# Patient Record
Sex: Female | Born: 1945 | Race: White | Hispanic: No | Marital: Married
Health system: Southern US, Community
[De-identification: ages and names within clinical notes are randomized; demographics above are authoritative.]

## PROBLEM LIST (undated history)

## (undated) HISTORY — PX: BREAST EXCISIONAL BIOPSY: SUR124

---

## 2002-04-23 ENCOUNTER — Encounter: Admission: RE | Admit: 2002-04-23 | Discharge: 2002-04-23 | Payer: Self-pay | Admitting: *Deleted

## 2002-06-05 ENCOUNTER — Encounter: Admission: RE | Admit: 2002-06-05 | Discharge: 2002-06-05 | Payer: Self-pay | Admitting: Internal Medicine

## 2002-06-05 ENCOUNTER — Encounter: Payer: Self-pay | Admitting: Internal Medicine

## 2003-05-01 ENCOUNTER — Encounter: Admission: RE | Admit: 2003-05-01 | Discharge: 2003-05-01 | Payer: Self-pay | Admitting: Internal Medicine

## 2004-06-11 ENCOUNTER — Encounter: Admission: RE | Admit: 2004-06-11 | Discharge: 2004-06-11 | Payer: Self-pay | Admitting: Internal Medicine

## 2005-07-20 ENCOUNTER — Encounter: Admission: RE | Admit: 2005-07-20 | Discharge: 2005-07-20 | Payer: Self-pay | Admitting: Internal Medicine

## 2005-08-03 ENCOUNTER — Encounter: Admission: RE | Admit: 2005-08-03 | Discharge: 2005-08-03 | Payer: Self-pay | Admitting: *Deleted

## 2006-10-31 ENCOUNTER — Encounter: Admission: RE | Admit: 2006-10-31 | Discharge: 2006-10-31 | Payer: Self-pay

## 2007-11-15 ENCOUNTER — Encounter: Admission: RE | Admit: 2007-11-15 | Discharge: 2007-11-15 | Payer: Self-pay | Admitting: Internal Medicine

## 2007-11-22 ENCOUNTER — Encounter: Admission: RE | Admit: 2007-11-22 | Discharge: 2007-11-22 | Payer: Self-pay | Admitting: Internal Medicine

## 2010-05-17 ENCOUNTER — Encounter: Payer: Self-pay | Admitting: Internal Medicine

## 2010-10-20 ENCOUNTER — Other Ambulatory Visit: Payer: Self-pay | Admitting: *Deleted

## 2010-10-20 DIAGNOSIS — Z1231 Encounter for screening mammogram for malignant neoplasm of breast: Secondary | ICD-10-CM

## 2010-11-29 ENCOUNTER — Ambulatory Visit
Admission: RE | Admit: 2010-11-29 | Discharge: 2010-11-29 | Disposition: A | Discharge status: Home / Self Care | Payer: Medicare Other | Source: Ambulatory Visit | Attending: *Deleted | Admitting: *Deleted

## 2010-11-29 DIAGNOSIS — Z1231 Encounter for screening mammogram for malignant neoplasm of breast: Secondary | ICD-10-CM

## 2010-12-02 ENCOUNTER — Other Ambulatory Visit: Payer: Self-pay | Admitting: *Deleted

## 2010-12-02 DIAGNOSIS — R928 Other abnormal and inconclusive findings on diagnostic imaging of breast: Secondary | ICD-10-CM

## 2010-12-08 ENCOUNTER — Ambulatory Visit
Admission: RE | Admit: 2010-12-08 | Discharge: 2010-12-08 | Disposition: A | Discharge status: Home / Self Care | Payer: Medicare Other | Source: Ambulatory Visit | Attending: *Deleted | Admitting: *Deleted

## 2010-12-08 DIAGNOSIS — R928 Other abnormal and inconclusive findings on diagnostic imaging of breast: Secondary | ICD-10-CM

## 2012-04-09 ENCOUNTER — Other Ambulatory Visit: Payer: Self-pay | Admitting: Physician Assistant

## 2012-04-09 DIAGNOSIS — Z1231 Encounter for screening mammogram for malignant neoplasm of breast: Secondary | ICD-10-CM

## 2012-05-16 ENCOUNTER — Ambulatory Visit
Admission: RE | Admit: 2012-05-16 | Discharge: 2012-05-16 | Disposition: A | Discharge status: Home / Self Care | Payer: Medicare Other | Source: Ambulatory Visit | Attending: Physician Assistant | Admitting: Physician Assistant

## 2012-05-16 DIAGNOSIS — Z1231 Encounter for screening mammogram for malignant neoplasm of breast: Secondary | ICD-10-CM

## 2012-05-21 ENCOUNTER — Other Ambulatory Visit: Payer: Self-pay | Admitting: Physician Assistant

## 2012-05-21 DIAGNOSIS — R928 Other abnormal and inconclusive findings on diagnostic imaging of breast: Secondary | ICD-10-CM

## 2012-05-25 ENCOUNTER — Ambulatory Visit
Admission: RE | Admit: 2012-05-25 | Discharge: 2012-05-25 | Disposition: A | Discharge status: Home / Self Care | Payer: Medicare Other | Source: Ambulatory Visit | Attending: Physician Assistant | Admitting: Physician Assistant

## 2012-05-25 DIAGNOSIS — R928 Other abnormal and inconclusive findings on diagnostic imaging of breast: Secondary | ICD-10-CM

## 2013-05-24 ENCOUNTER — Other Ambulatory Visit: Payer: Self-pay

## 2013-05-24 DIAGNOSIS — Z1231 Encounter for screening mammogram for malignant neoplasm of breast: Secondary | ICD-10-CM

## 2013-05-28 ENCOUNTER — Ambulatory Visit: Payer: Medicare Other

## 2013-07-31 ENCOUNTER — Other Ambulatory Visit: Payer: Self-pay | Admitting: Physician Assistant

## 2013-07-31 DIAGNOSIS — N63 Unspecified lump in unspecified breast: Secondary | ICD-10-CM

## 2013-08-09 ENCOUNTER — Ambulatory Visit
Admission: RE | Admit: 2013-08-09 | Discharge: 2013-08-09 | Disposition: A | Discharge status: Home / Self Care | Payer: Medicare Other | Source: Ambulatory Visit

## 2013-08-09 ENCOUNTER — Ambulatory Visit
Admission: RE | Admit: 2013-08-09 | Discharge: 2013-08-09 | Disposition: A | Discharge status: Home / Self Care | Payer: Medicare Other | Source: Ambulatory Visit | Attending: Physician Assistant | Admitting: Physician Assistant

## 2013-08-09 ENCOUNTER — Other Ambulatory Visit: Payer: Self-pay | Admitting: Physician Assistant

## 2013-08-09 DIAGNOSIS — N63 Unspecified lump in unspecified breast: Secondary | ICD-10-CM

## 2013-08-09 DIAGNOSIS — Z1231 Encounter for screening mammogram for malignant neoplasm of breast: Secondary | ICD-10-CM

## 2014-06-02 ENCOUNTER — Other Ambulatory Visit (HOSPITAL_COMMUNITY): Payer: Self-pay | Admitting: Obstetrics and Gynecology

## 2014-06-02 DIAGNOSIS — N950 Postmenopausal bleeding: Secondary | ICD-10-CM

## 2014-06-04 ENCOUNTER — Ambulatory Visit (HOSPITAL_COMMUNITY)
Admission: RE | Admit: 2014-06-04 | Discharge: 2014-06-04 | Disposition: A | Discharge status: Home / Self Care | Payer: Medicare Other | Source: Ambulatory Visit | Attending: Obstetrics and Gynecology | Admitting: Obstetrics and Gynecology

## 2014-06-04 DIAGNOSIS — N950 Postmenopausal bleeding: Secondary | ICD-10-CM | POA: Diagnosis present

## 2014-10-16 ENCOUNTER — Other Ambulatory Visit: Payer: Self-pay

## 2014-10-16 DIAGNOSIS — Z1231 Encounter for screening mammogram for malignant neoplasm of breast: Secondary | ICD-10-CM

## 2014-11-19 ENCOUNTER — Ambulatory Visit
Admission: RE | Admit: 2014-11-19 | Discharge: 2014-11-19 | Disposition: A | Discharge status: Home / Self Care | Payer: Medicare Other | Source: Ambulatory Visit

## 2014-11-19 DIAGNOSIS — Z1231 Encounter for screening mammogram for malignant neoplasm of breast: Secondary | ICD-10-CM

## 2015-04-25 IMAGING — US US PELVIS COMPLETE
1 series · 13 of 25 positions shown · non-contrast
Comparison: None

CLINICAL DATA: Dysfunctional uterine bleeding x1 week

EXAM:
TRANSABDOMINAL AND TRANSVAGINAL ULTRASOUND OF PELVIS
TECHNIQUE: Both transabdominal and transvaginal ultrasound examinations of the
pelvis were performed. Transabdominal technique was performed for
global imaging of the pelvis including uterus, ovaries, adnexal
regions, and pelvic cul-de-sac. It was necessary to proceed with
endovaginal exam following the transabdominal exam to visualize the
endometrium and left ovary.

[Series 1: us pelvis complete · 0.21mm/px · 13 of 46 slices shown]
[im 1/46]
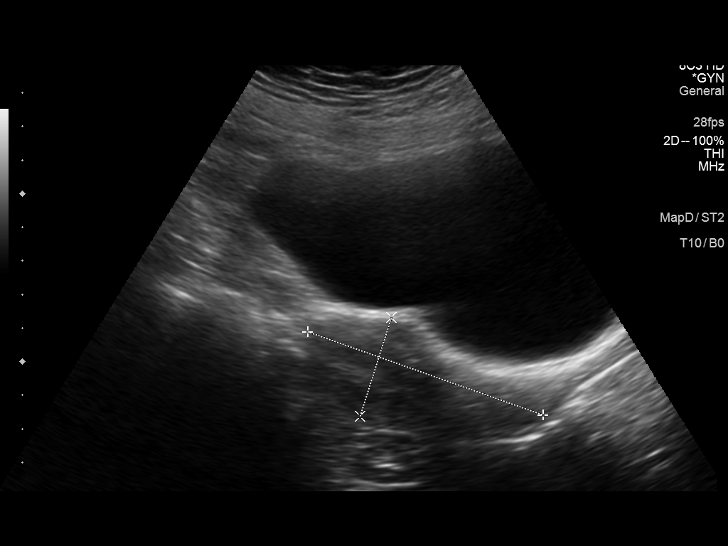
[im 4/46]
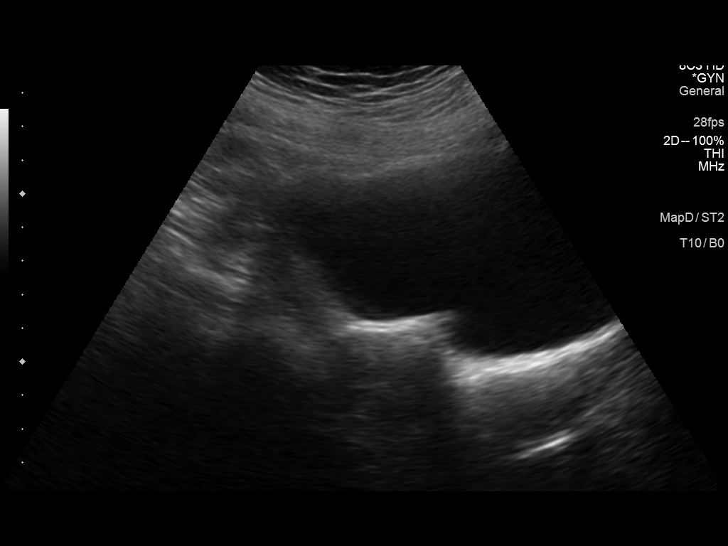
[im 8/46]
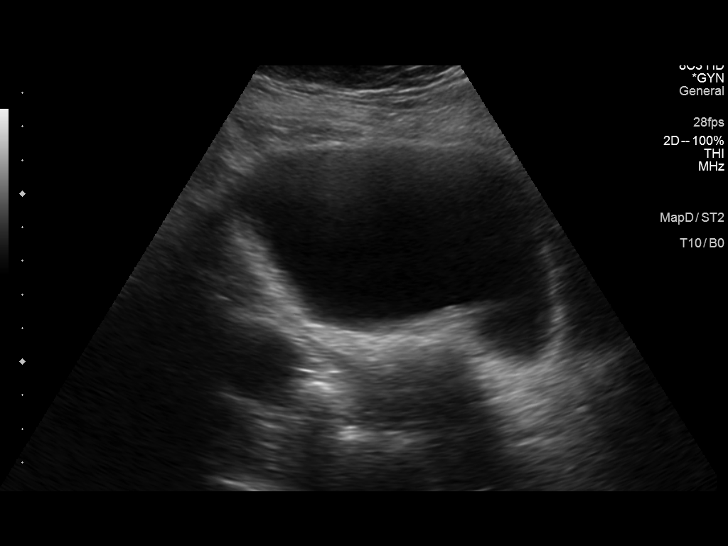
[im 12/46]
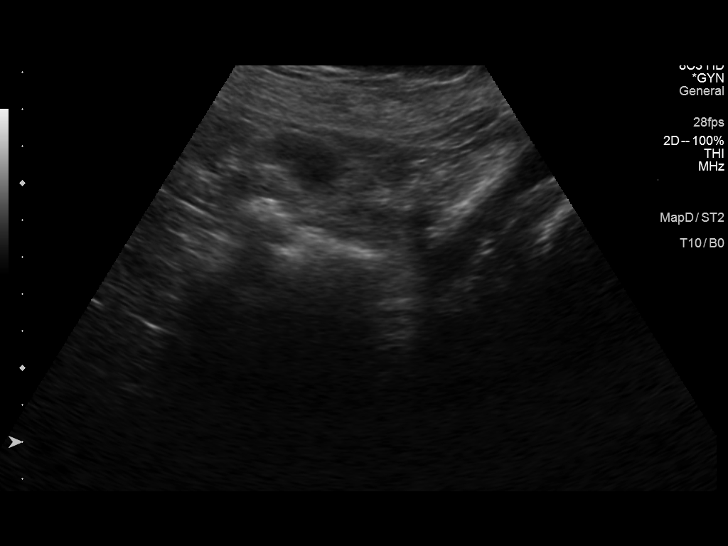
[im 16/46]
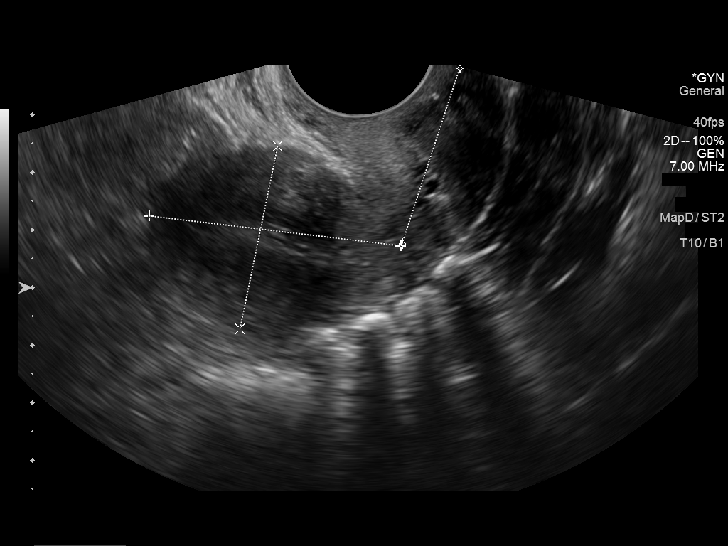
[im 19/46]
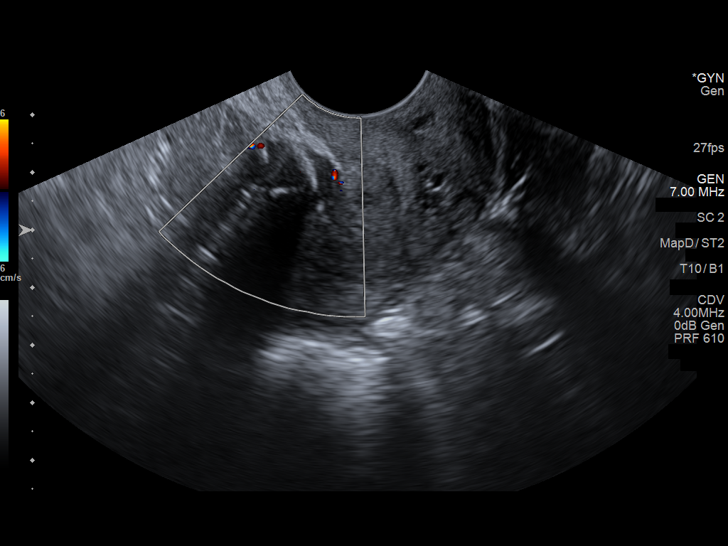
[im 23/46]
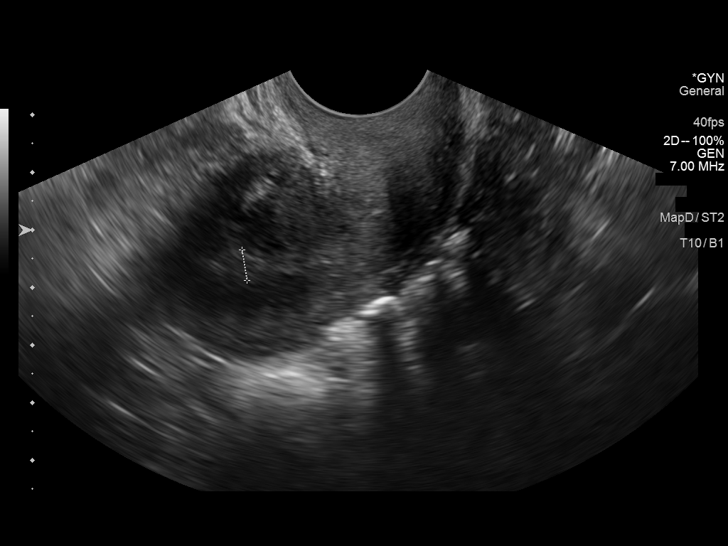
[im 27/46]
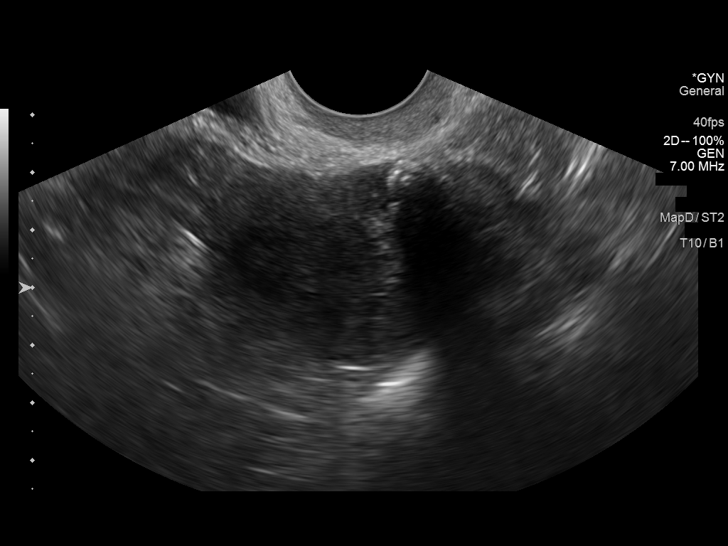
[im 31/46]
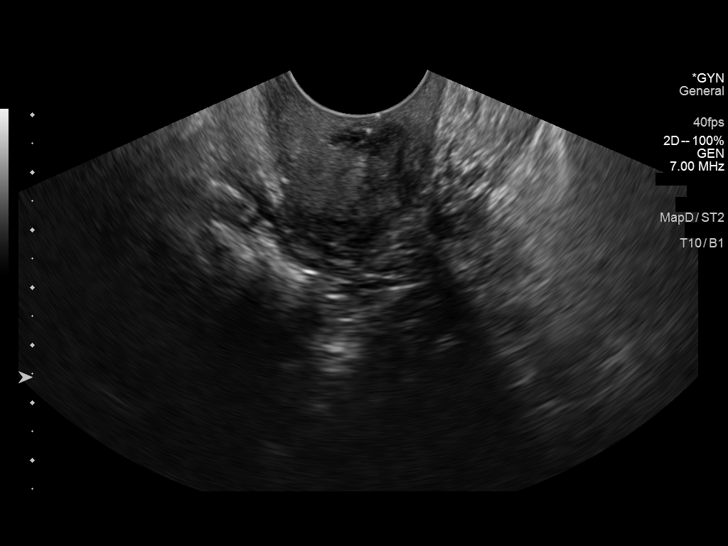
[im 34/46]
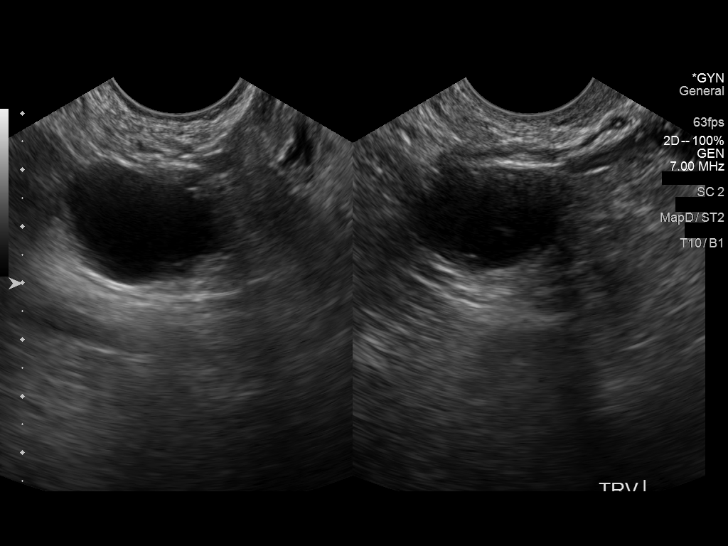
[im 38/46]
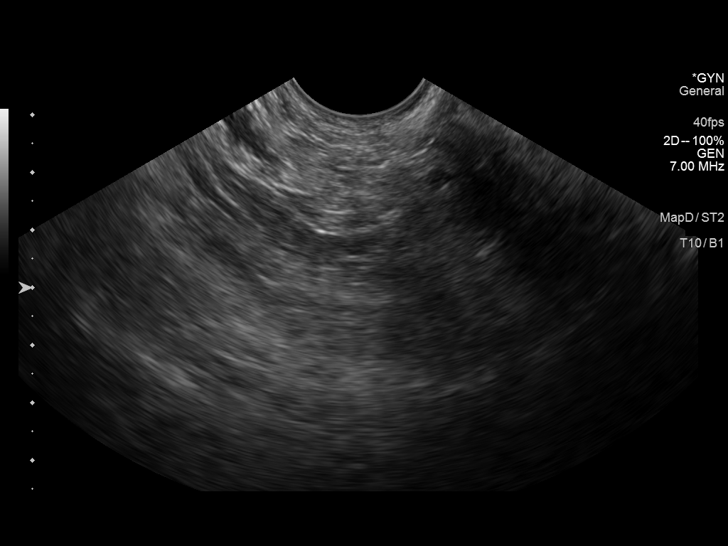
[im 42/46]
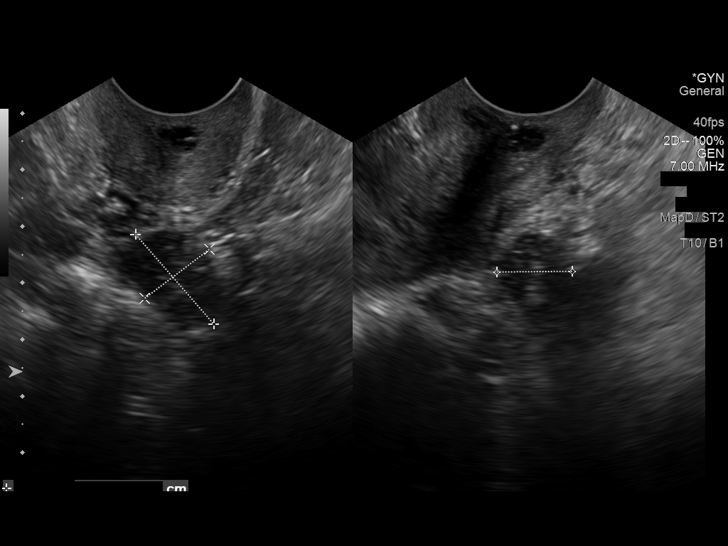
[im 46/46]
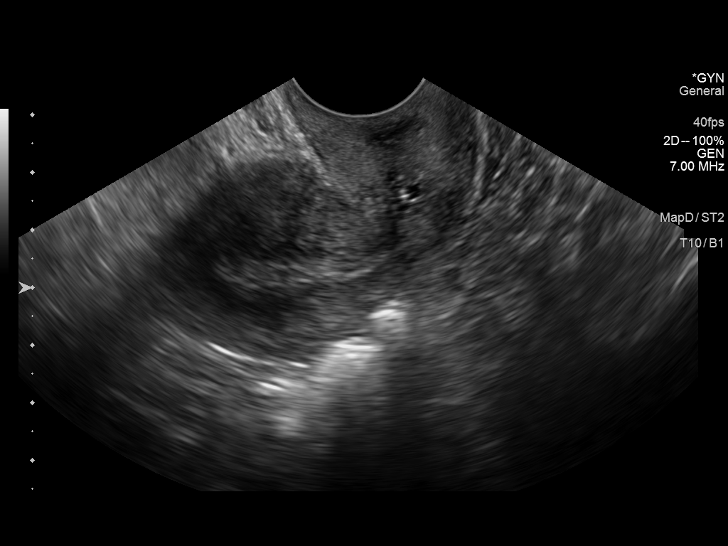

[13 of 25 positions shown; findings below may reference images not displayed]

FINDINGS: Uterus

Measurements: 7.6 x 3.2 x 5.0 cm. 1.7 x 1.5 x 1.5 cm subserosal,
partially calcified fibroid in the left anterior fundus.

Endometrium

Thickness: 5 mm.  No focal abnormality visualized.

Right ovary

Measurements: 3.7 x 2.4 x 3.1 cm. 2.7 x 1.8 x 2.6 cm simple
cyst/follicle.

Left ovary

Measurements: 2.1 x 1.4 x 1.3 cm. Normal appearance/no adnexal mass.

Other findings

No free fluid.
IMPRESSION: Endometrial complex measures 5 mm. In the setting of post-menopausal
bleeding, endometrial sampling is indicated to exclude carcinoma. If
results are benign, sonohysterogram should be considered for focal
lesion work-up.

1.7 cm partially calcified subserosal fibroid in the left anterior
fundus.

(Ref: Radiological Reasoning: Algorithmic Workup of Abnormal Vaginal
Bleeding with Endovaginal Sonography and Sonohysterography. AJR
2775; 191:S68-73).

## 2015-11-11 ENCOUNTER — Other Ambulatory Visit: Payer: Self-pay | Admitting: Physician Assistant

## 2015-11-11 DIAGNOSIS — Z1231 Encounter for screening mammogram for malignant neoplasm of breast: Secondary | ICD-10-CM

## 2015-11-26 ENCOUNTER — Ambulatory Visit
Admission: RE | Admit: 2015-11-26 | Discharge: 2015-11-26 | Disposition: A | Discharge status: Home / Self Care | Payer: Medicare Other | Source: Ambulatory Visit | Attending: Physician Assistant | Admitting: Physician Assistant

## 2015-11-26 DIAGNOSIS — Z1231 Encounter for screening mammogram for malignant neoplasm of breast: Secondary | ICD-10-CM

## 2017-02-06 ENCOUNTER — Other Ambulatory Visit: Payer: Self-pay | Admitting: Family Medicine

## 2017-02-06 DIAGNOSIS — Z1231 Encounter for screening mammogram for malignant neoplasm of breast: Secondary | ICD-10-CM

## 2017-04-03 ENCOUNTER — Ambulatory Visit: Payer: Medicare Other

## 2017-05-03 ENCOUNTER — Ambulatory Visit: Payer: Medicare Other

## 2017-08-10 ENCOUNTER — Other Ambulatory Visit: Payer: Self-pay | Admitting: Family Medicine

## 2017-08-10 DIAGNOSIS — Z1231 Encounter for screening mammogram for malignant neoplasm of breast: Secondary | ICD-10-CM

## 2017-08-14 ENCOUNTER — Ambulatory Visit: Payer: Medicare Other

## 2017-08-14 ENCOUNTER — Ambulatory Visit
Admission: RE | Admit: 2017-08-14 | Discharge: 2017-08-14 | Disposition: A | Discharge status: Home / Self Care | Payer: Medicare Other | Source: Ambulatory Visit | Attending: Family Medicine | Admitting: Family Medicine

## 2017-08-14 DIAGNOSIS — Z1231 Encounter for screening mammogram for malignant neoplasm of breast: Secondary | ICD-10-CM

## 2020-06-30 ENCOUNTER — Other Ambulatory Visit: Payer: Self-pay | Admitting: Family Medicine

## 2020-06-30 DIAGNOSIS — R5381 Other malaise: Secondary | ICD-10-CM

## 2022-09-22 ENCOUNTER — Encounter: Payer: Self-pay | Admitting: Podiatry

## 2022-09-29 ENCOUNTER — Encounter: Payer: Self-pay | Admitting: Podiatry

## 2022-09-29 DIAGNOSIS — Z1231 Encounter for screening mammogram for malignant neoplasm of breast: Secondary | ICD-10-CM

## 2023-02-06 ENCOUNTER — Encounter: Payer: Self-pay | Admitting: Physician Assistant

## 2023-02-06 DIAGNOSIS — Z1231 Encounter for screening mammogram for malignant neoplasm of breast: Secondary | ICD-10-CM

## 2023-04-04 ENCOUNTER — Encounter: Payer: Self-pay | Admitting: Family Medicine

## 2023-04-05 ENCOUNTER — Other Ambulatory Visit: Payer: Self-pay | Admitting: Family Medicine

## 2023-04-05 DIAGNOSIS — R928 Other abnormal and inconclusive findings on diagnostic imaging of breast: Secondary | ICD-10-CM

## 2023-05-08 ENCOUNTER — Other Ambulatory Visit: Payer: Self-pay | Admitting: Family Medicine

## 2023-05-08 ENCOUNTER — Other Ambulatory Visit: Payer: Medicare Other

## 2023-05-08 DIAGNOSIS — Z1231 Encounter for screening mammogram for malignant neoplasm of breast: Secondary | ICD-10-CM

## 2023-05-09 ENCOUNTER — Ambulatory Visit
Admission: RE | Admit: 2023-05-09 | Discharge: 2023-05-09 | Disposition: A | Discharge status: Home / Self Care | Payer: Medicare Other | Source: Ambulatory Visit | Attending: Family Medicine | Admitting: Family Medicine

## 2023-05-09 DIAGNOSIS — Z1231 Encounter for screening mammogram for malignant neoplasm of breast: Secondary | ICD-10-CM
